# Patient Record
Sex: Female | Born: 1995 | Race: White | Hispanic: No | Marital: Married | Smoking: Never smoker
Health system: Southern US, Community
[De-identification: ages and names within clinical notes are randomized; demographics above are authoritative.]

---

## 2019-06-15 NOTE — L&D Delivery Note (Addendum)
Called to see patient for delivery  Just about 2-1/2 hours after placing 600 mcg of vaginal Cytotec patient called out to the nurse with extreme vaginal pressure.  Patient had only had minimal cramping prior to this and had not asked for any pain medications.  Upon check nurse noted fetal vertex at the introitus.  While nurse was calling me and setting up a delivery table baby spontaneously delivered.  Per nurse no respiratory effort and no heartbeat.  I arrived shortly after.  Cord was clamped and cut by nurse and parents were holding baby.  Perineal examination revealed no obstetric lacerations.  Placenta had not yet separated and a thin cord was noted outside the introitus.  At this time patient is undelivered of the placenta and planning expectant management.  Lendon Colonel 10/13/2019 1:08 AM    Almost 2 hours after delivery of the baby and without any additional Cytotec, patient went to the bathroom to void and placenta passed spontaneously . scant bleeding following.  Digital exam confirmed no additional products in the vagina and cervix was several centimeters dilated but exam is limited by patient's tension with exam, body habitus habitus, and long vagina.  No perineal lacerations were noted.  No bleeding was noted.  Placenta was examined.  Marginal cord insertion was noted.  Small clot on the placenta which appeared slightly irregular but intact.   Plan to keep patient for extended monitoring until the morning.  As long as no additional bleeding, stable vital signs and no signs of infection will be able to discharge to home to labor floor.  Lendon Colonel 10/13/2019 2:19 AM

## 2019-08-21 ENCOUNTER — Inpatient Hospital Stay (HOSPITAL_COMMUNITY): Admit: 2019-08-21 | Payer: Self-pay | Admitting: Obstetrics & Gynecology

## 2019-10-12 ENCOUNTER — Inpatient Hospital Stay (HOSPITAL_COMMUNITY)
Admission: EM | Admit: 2019-10-12 | Discharge: 2019-10-13 | DRG: 805 | Disposition: A | Discharge status: Home / Self Care | Payer: BC Managed Care – PPO | Attending: Obstetrics | Admitting: Obstetrics

## 2019-10-12 ENCOUNTER — Other Ambulatory Visit: Payer: Self-pay

## 2019-10-12 ENCOUNTER — Inpatient Hospital Stay (HOSPITAL_BASED_OUTPATIENT_CLINIC_OR_DEPARTMENT_OTHER): Payer: BC Managed Care – PPO

## 2019-10-12 ENCOUNTER — Encounter (HOSPITAL_COMMUNITY): Payer: Self-pay | Admitting: Obstetrics

## 2019-10-12 DIAGNOSIS — O418X20 Other specified disorders of amniotic fluid and membranes, second trimester, not applicable or unspecified: Secondary | ICD-10-CM | POA: Diagnosis not present

## 2019-10-12 DIAGNOSIS — Z20822 Contact with and (suspected) exposure to covid-19: Secondary | ICD-10-CM | POA: Diagnosis present

## 2019-10-12 DIAGNOSIS — O4692 Antepartum hemorrhage, unspecified, second trimester: Secondary | ICD-10-CM | POA: Diagnosis not present

## 2019-10-12 DIAGNOSIS — Z3A18 18 weeks gestation of pregnancy: Secondary | ICD-10-CM

## 2019-10-12 DIAGNOSIS — O42912 Preterm premature rupture of membranes, unspecified as to length of time between rupture and onset of labor, second trimester: Secondary | ICD-10-CM | POA: Diagnosis present

## 2019-10-12 DIAGNOSIS — O42012 Preterm premature rupture of membranes, onset of labor within 24 hours of rupture, second trimester: Secondary | ICD-10-CM | POA: Diagnosis present

## 2019-10-12 DIAGNOSIS — Z7982 Long term (current) use of aspirin: Secondary | ICD-10-CM | POA: Diagnosis not present

## 2019-10-12 DIAGNOSIS — O4690 Antepartum hemorrhage, unspecified, unspecified trimester: Secondary | ICD-10-CM

## 2019-10-12 DIAGNOSIS — O3680X0 Pregnancy with inconclusive fetal viability, not applicable or unspecified: Secondary | ICD-10-CM

## 2019-10-12 LAB — URINALYSIS, ROUTINE W REFLEX MICROSCOPIC
Bilirubin Urine: NEGATIVE
Glucose, UA: NEGATIVE mg/dL
Ketones, ur: NEGATIVE mg/dL
Nitrite: NEGATIVE
Protein, ur: 100 mg/dL — AB
Specific Gravity, Urine: 1.002 — ABNORMAL LOW (ref 1.005–1.030)
pH: 7 (ref 5.0–8.0)

## 2019-10-12 LAB — TYPE AND SCREEN
ABO/RH(D): B POS
Antibody Screen: NEGATIVE

## 2019-10-12 LAB — HEMOGLOBIN A1C
Hgb A1c MFr Bld: 5.4 % (ref 4.8–5.6)
Mean Plasma Glucose: 108.28 mg/dL

## 2019-10-12 LAB — CBC WITH DIFFERENTIAL/PLATELET
Abs Immature Granulocytes: 0.06 10*3/uL (ref 0.00–0.07)
Basophils Absolute: 0 10*3/uL (ref 0.0–0.1)
Basophils Relative: 0 %
Eosinophils Absolute: 0 10*3/uL (ref 0.0–0.5)
Eosinophils Relative: 0 %
HCT: 38.5 % (ref 36.0–46.0)
Hemoglobin: 13.1 g/dL (ref 12.0–15.0)
Immature Granulocytes: 0 %
Lymphocytes Relative: 13 %
Lymphs Abs: 2.1 10*3/uL (ref 0.7–4.0)
MCH: 30.1 pg (ref 26.0–34.0)
MCHC: 34 g/dL (ref 30.0–36.0)
MCV: 88.5 fL (ref 80.0–100.0)
Monocytes Absolute: 0.8 10*3/uL (ref 0.1–1.0)
Monocytes Relative: 5 %
Neutro Abs: 12.9 10*3/uL — ABNORMAL HIGH (ref 1.7–7.7)
Neutrophils Relative %: 82 %
Platelets: 308 10*3/uL (ref 150–400)
RBC: 4.35 MIL/uL (ref 3.87–5.11)
RDW: 12.7 % (ref 11.5–15.5)
WBC: 15.9 10*3/uL — ABNORMAL HIGH (ref 4.0–10.5)
nRBC: 0 % (ref 0.0–0.2)

## 2019-10-12 LAB — WET PREP, GENITAL
Clue Cells Wet Prep HPF POC: NONE SEEN
Sperm: NONE SEEN
Trich, Wet Prep: NONE SEEN
Yeast Wet Prep HPF POC: NONE SEEN

## 2019-10-12 LAB — RESPIRATORY PANEL BY RT PCR (FLU A&B, COVID)
Influenza A by PCR: NEGATIVE
Influenza B by PCR: NEGATIVE
SARS Coronavirus 2 by RT PCR: NEGATIVE

## 2019-10-12 LAB — POCT FERN TEST: POCT Fern Test: NEGATIVE

## 2019-10-12 LAB — TSH: TSH: 2.504 u[IU]/mL (ref 0.350–4.500)

## 2019-10-12 LAB — ABO/RH: ABO/RH(D): B POS

## 2019-10-12 MED ORDER — MISOPROSTOL 200 MCG PO TABS: 400.0000 ug | ORAL_TABLET | ORAL | Status: DC

## 2019-10-12 MED ORDER — SOD CITRATE-CITRIC ACID 500-334 MG/5ML PO SOLN: 30.0000 mL | ORAL | Status: DC | PRN

## 2019-10-12 MED ORDER — LACTATED RINGERS IV SOLN: INTRAVENOUS | Status: DC

## 2019-10-12 MED ORDER — LACTATED RINGERS IV SOLN: 500.0000 mL | INTRAVENOUS | Status: DC | PRN

## 2019-10-12 MED ORDER — MISOPROSTOL 200 MCG PO TABS
600.0000 ug | ORAL_TABLET | Freq: Once | ORAL | Status: AC
  Administered 2019-10-12: 600 ug via VAGINAL
  Filled 2019-10-12: qty 3

## 2019-10-12 MED ORDER — OXYCODONE-ACETAMINOPHEN 5-325 MG PO TABS: 2.0000 | ORAL_TABLET | ORAL | Status: DC | PRN

## 2019-10-12 MED ORDER — LIDOCAINE HCL (PF) 1 % IJ SOLN: 30.0000 mL | INTRAMUSCULAR | Status: DC | PRN

## 2019-10-12 MED ORDER — ACETAMINOPHEN 325 MG PO TABS: 650.0000 mg | ORAL_TABLET | ORAL | Status: DC | PRN

## 2019-10-12 MED ORDER — OXYCODONE-ACETAMINOPHEN 5-325 MG PO TABS: 1.0000 | ORAL_TABLET | ORAL | Status: DC | PRN

## 2019-10-12 MED ORDER — ONDANSETRON HCL 4 MG/2ML IJ SOLN: 4.0000 mg | Freq: Four times a day (QID) | INTRAMUSCULAR | Status: DC | PRN

## 2019-10-12 MED ORDER — FENTANYL CITRATE (PF) 100 MCG/2ML IJ SOLN
50.0000 ug | INTRAMUSCULAR | Status: DC | PRN
  Administered 2019-10-13: 100 ug via INTRAVENOUS
  Filled 2019-10-12: qty 2

## 2019-10-12 NOTE — MAU Provider Note (Signed)
History     CSN: [id number]  Arrival date and time: 10/12/19 1623   First Provider Initiated Contact with Patient 10/12/19 1721      Chief Complaint  Patient presents with  . Vaginal Bleeding   24 y.o. G1 @18 .1 wks presenting for VB. Reports having some LBP earlier today then around 4pm felt a pop and had a splash of fluid come out. She saw blood when she wiped. She soaked a pad since that time. Denies abdominal pain or cramping.   OB History    Gravida  1   Para      Term      Preterm      AB      Living        SAB      TAB      Ectopic      Multiple      Live Births              History reviewed. No pertinent past medical history.  History reviewed. No pertinent surgical history.  History reviewed. No pertinent family history.  Social History   Tobacco Use  . Smoking status: Never Smoker  . Smokeless tobacco: Never Used  Substance Use Topics  . Alcohol use: Never  . Drug use: Never    Allergies: Not on File  Medications Prior to Admission  Medication Sig Dispense Refill Last Dose  . aspirin EC 81 MG tablet Take 81 mg by mouth daily.   10/12/2019 at Unknown time  . metFORMIN (GLUCOPHAGE) 1000 MG tablet Take 1,000 mg by mouth 2 (two) times daily with a meal.   Past Month at Unknown time  . omega-3 acid ethyl esters (LOVAZA) 1 g capsule Take by mouth daily.   10/12/2019 at Unknown time  . Prenatal Vit-Fe Fumarate-FA (MULTIVITAMIN-PRENATAL) 27-0.8 MG TABS tablet Take 1 tablet by mouth daily at 12 noon.   10/12/2019 at Unknown time    Review of Systems  Gastrointestinal: Negative for abdominal pain.  Genitourinary: Positive for vaginal bleeding and vaginal discharge.   Physical Exam   Blood pressure 138/84, pulse (!) 110, temperature 99.4 F (37.4 C), temperature source Oral, resp. rate 17, height 5\' 3"  (1.6 m), weight 95.3 kg, SpO2 100 %.  Physical Exam  Nursing note and vitals reviewed. Constitutional: She is oriented to person, place, and  time. She appears well-developed and well-nourished. No distress.  HENT:  Head: Normocephalic and atraumatic.  Respiratory: Effort normal. No respiratory distress.  GI: Soft. She exhibits no distension and no mass. There is no abdominal tenderness. There is no rebound and no guarding.  Genitourinary:    Genitourinary Comments: SSE: no pool, bloody discharge cleared with 2 fox swabs, ?membranes protruding from os, visually closed Fern neg   Musculoskeletal:        General: Normal range of motion.     Cervical back: Normal range of motion.  Neurological: She is alert and oriented to person, place, and time.  Skin: Skin is warm and dry.  Psychiatric: She has a normal mood and affect.   Results for orders placed or performed during the hospital encounter of 10/12/19 (from the past 24 hour(s))  Wet prep, genital     Status: Abnormal   Collection Time: 10/12/19  5:31 PM  Result Value Ref Range   Yeast Wet Prep HPF POC NONE SEEN NONE SEEN   Trich, Wet Prep NONE SEEN NONE SEEN   Clue Cells Wet Prep HPF POC NONE SEEN  NONE SEEN   WBC, Wet Prep HPF POC FEW (A) NONE SEEN   Sperm NONE SEEN   Fern Test     Status: None   Collection Time: 10/12/19  5:36 PM  Result Value Ref Range   POCT Fern Test Negative = intact amniotic membranes   Urinalysis, Routine w reflex microscopic     Status: Abnormal   Collection Time: 10/12/19  5:36 PM  Result Value Ref Range   Color, Urine AMBER (A) YELLOW   APPearance CLEAR CLEAR   Specific Gravity, Urine 1.002 (L) 1.005 - 1.030   pH 7.0 5.0 - 8.0   Glucose, UA NEGATIVE NEGATIVE mg/dL   Hgb urine dipstick LARGE (A) NEGATIVE   Bilirubin Urine NEGATIVE NEGATIVE   Ketones, ur NEGATIVE NEGATIVE mg/dL   Protein, ur 828 (A) NEGATIVE mg/dL   Nitrite NEGATIVE NEGATIVE   Leukocytes,Ua TRACE (A) NEGATIVE   RBC / HPF 0-5 0 - 5 RBC/hpf   WBC, UA 0-5 0 - 5 WBC/hpf   Bacteria, UA RARE (A) NONE SEEN   Squamous Epithelial / LPF 0-5 0 - 5   [place]: cephalic, anhydramnios,  FHR 100 1840: discussed presentation and clinical findings with Dr. Ernestina Penna, will come discuss results and mgt with pt. MAU Course  Procedures  MDM Labs and [place] ordered and reviewed. PROM confirmed. Mngt per private OB.  Assessment and Plan  [redacted] weeks gestation Previable PROM Admit to LD  Mngt per Dr. Lorna Few, CNM 10/12/2019, 5:32 PM

## 2019-10-12 NOTE — MAU Note (Signed)
.  Rhonda Davis is a 24 y.o. at [redacted]w[redacted]d here in MAU reporting c/o of back pain that started at 0730. Pt reports adbominal pain that began around 1100. Pt reports she sat down to use the restroom at 1540 and felt a pop with a rush of fluids. When the patient wiped she stated that it was bloody and has since soaked one pad completely.   Onset of complaint: 10/12/19

## 2019-10-12 NOTE — H&P (Signed)
Rhonda Davis is a 24 y.o. G1P0 at [redacted]w[redacted]d presenting for preterm previable rupture of membranes. Pt notes no contractions but does note an increase in back pain.  Patient is unsure whether these are contractions are due to sitting in the uncomfortable bed in the emergency room.  Patient reports no abdominal pain.  Patient states at 4 PM today she went to the bathroom for a bowel movement and then felt a pop and noted fluid began leaking from her vagina.  She continued to leak for some time.  She noted scant bleeding with wiping but no heavy bleeding since then.  Patient's risk factors for cervical incompetence are in vitro fertilization but no other risk factors.  [facility] [place] since 10 wks -IVF pregnancy.  This pregnancy is conceived with her transmale husband's egg and donor sperm.  Preimplantation genetic testing was not done.  Normal NT scan. -PCOS.  Patient was on Metformin prior to pregnancy and in early pregnancy.  Patient stopped metformin for early diabetic screen which was 144 and held the metformin until her 3-hour GTT was done which was normal.  She has yet to resume her Metformin.   Prenatal Transfer Tool  Maternal Diabetes: No Genetic Screening: Normal Maternal Ultrasounds/Referrals: Normal Fetal Ultrasounds or other Referrals:  None Maternal Substance Abuse:  No Significant Maternal Medications:  None Significant Maternal Lab Results: None     OB History    Gravida  1   Para      Term      Preterm      AB      Living        SAB      TAB      Ectopic      Multiple      Live Births             History reviewed. No pertinent past medical history. History reviewed. No pertinent surgical history. Family History: family history is not on file. Social History:  reports that she has never smoked. She has never used smokeless tobacco. She reports that she does not drink alcohol or use drugs.  Review of Systems - Negative except Leaking fluid and  back pain     Blood pressure (!) 139/91, pulse (!) 114, temperature 98.8 F (37.1 C), temperature source Oral, resp. rate 17, height 5\' 3"  (1.6 m), weight 95.3 kg, SpO2 100 %.  Physical Exam:  Gen: well appearing, no distress, tearful and appropriate upon discussion of situation  Back: no CVAT Abd: gravid, NT, no RUQ pain LE: No edema, equal bilaterally, non-tender Toco: Not assessed FH: Present by ultrasound GU: Not repeated by me, per CNM in MAU 2 fox swabs worth of bloody discharge possible membranes protruding from os which was visibly closed  Real-time ultrasound: 18-week vertex pregnancy with active heartbeat, cervical length 4.5 cm, anhydramnios  Prenatal labs: ABO, Rh:  B positive Antibody:  Negative Rubella:  Immune RPR:   Nonreactive HBsAg:   Negative HIV:   Negative GBS:   Not yet done 1 hr Glucola 144 with normal 3-hour GTT at first trimester  Genetic screening normal NT Anatomy US not yet done   Assessment/Plan: 24 y.o. G1P0 at [redacted]w[redacted]d Preterm previable rupture of membranes.  Findings discussed with patient who was appropriately upset.  Reviewed options of expectant management with close outpatient follow-up.  I discussed with patient possibility of resealing of membranes and reaccumulation of amniotic fluid with potential to prolong the pregnancy.  We discussed  if amniotic fluid did not reaccumulate normal fetal lungs would not develop.  We also discussed the possibility that with expectant management patient could develop chorioamnionitis which could lead to more systemic infection and or problems with bleeding.  We discussed the possibility for close outpatient management for developing infection and ultrasounds to assess for fluid reaccumulation.  We discussed that while fluid reaccumulation is a possibility,that at her very early gestational age even with a several week latency this would still not get this pregnancy to viability.  Patient remains nervous about going  home and the possibility of expectant management.  Patient was offered ability to spend the night for further evaluation and to give her and her spouse time to process the events and make a decision.  Patient seems very concerned about the possibility of having to deliver a previable pregnancy.  We briefly discussed options of a D&C and risk and benefits of a D&C were discussed with patient.  We then discussed Cytotec induction of labor which patient expressed interest in.  I discussed patient should proceed to the labor floor she would be given IV and would plan vaginal Cytotec to allow cervical dilation.  I discussed with patient the process would seem similar to labor.  Patient would have the option for IV pain medications and epidural.  Patient is desiring an epidural.  We discussed options for chromosomal testing and autopsy both of which patient and husband expressed an interest in doing.  We discussed the possibility that baby may be born dead or alive and the ability to see and hold the baby.  They would like to see and hold the baby in either event.  I discussed that baby may not seem fully formed due to early gestational age and they were prepared for this.  I discussed the possibility of retained placenta and possibly needing to proceed to the OR for D&C of a retained placenta.  I discussed with them the likelihood they may develop a fever during the labor process either from infection or from multiple doses of Cytotec.  Patient has no known drug allergies and I discussed we would give antibiotics in the event a fever developed.  Given the low chance of success of healthy pregnancy with previable rupture at 18 weeks and infectious risk patient and spouse have decided for termination.  Lendon Colonel 10/12/2019 8:39 PM

## 2019-10-13 LAB — RPR: RPR Ser Ql: NONREACTIVE

## 2019-10-13 MED ORDER — OXYTOCIN 40 UNITS IN NORMAL SALINE INFUSION - SIMPLE MED
INTRAVENOUS | Status: AC
  Filled 2019-10-13: qty 1000

## 2019-10-13 MED ORDER — IBUPROFEN 600 MG PO TABS: 600.0000 mg | ORAL_TABLET | Freq: Four times a day (QID) | ORAL | Status: DC

## 2019-10-13 MED ORDER — ZOLPIDEM TARTRATE ER 6.25 MG PO TBCR
6.2500 mg | EXTENDED_RELEASE_TABLET | Freq: Every evening | ORAL | 0 refills | Status: AC | PRN
Start: 2019-10-13 — End: ?

## 2019-10-13 NOTE — Discharge Instructions (Signed)

## 2019-10-13 NOTE — Progress Notes (Signed)
Dr. Ernestina Penna notified at 0016 that patient was having increased pain and pressure. Cervical exam was done and baby was in the vaginal canal. Infant delivered at 0017 with no signs of life in the bed. The cord was clamped and baby was wrapped in a blanket and placed on mothers chest. Parents were given time to hold baby. Baby was taken to the comfort room at 0130. Pictures and footprints were taken. Parents were given the memory box. Placenta delivered at 0204.

## 2019-10-13 NOTE — Progress Notes (Signed)
Entered room to see pt and support person. Both sleeping soundly. RN name written on whiteboard for reference. Will check back in and evaluate pt in 30-40 minutes.

## 2019-10-13 NOTE — Progress Notes (Signed)
0900 Eating breakfast with family member. Talking on phone with family. Pt set up to take shower.

## 2019-10-14 NOTE — Discharge Summary (Signed)
Patient ID: Rhonda Davis MRN: [id number] DOB/AGE: 10-25-1995 24 y.o.  Admit date: 10/12/2019 Discharge date: Oct 13, 2019  Admission Diagnoses: Preterm PROM w/onset labor within 24 hours rupture in 2nd trimester [O42.012]  Discharge Diagnoses: Preterm PROM w/onset labor within 24 hours rupture in 2nd trimester [O42.012]         Discharged Condition: stable  Hospital Course: Patient presented to ER and then to maternity admission with vaginal bleeding, large gush of fluid and back pain.  She was found to have a white count of 15.9 with left shift, nontender uterus, possible membranes protruding from the cervical os and anhydramnios consistent with ruptured membranes.  Long consult was had with the patient regarding expectant management versus D&C versus induction of labor with Cytotec.  Patient chose the latter.  She had 600 mcg of Cytotec placed once and had rapid onset of labor and delivery of a nonviable and nonbreathing 18-week fetus without heartbeat about 3 hours later.  She had no obstetrical lacerations.  Spontaneous delivery of the placenta about 2 hours later.  Patient and spouse held the baby.  Patient requested autopsy and genetic screening.  Patient never developed a fever.  Patient was watched overnight.  In the morning bleeding was minimal, uterus was nontender patient was counseled about reasons to call or return to the hospital.  Emotional support was discussed and patient and husband seem to to be appropriate.  Consults: None and MFM  Treatments: Induction of labor with Cytotec due to previable preterm premature rupture of membranes and delivery of a nonviable fetus  Disposition: home   Allergies as of 10/13/2019   No Known Allergies     Medication List    STOP taking these medications   aspirin EC 81 MG tablet     TAKE these medications   metFORMIN 1000 MG tablet Commonly known as: GLUCOPHAGE Take 1,000 mg by mouth 2 (two) times daily with a meal.    multivitamin-prenatal 27-0.8 MG Tabs tablet Take 1 tablet by mouth daily at 12 noon.   omega-3 acid ethyl esters 1 g capsule Commonly known as: LOVAZA Take by mouth daily.   zolpidem 6.25 MG CR tablet Commonly known as: Ambien CR Take 1 tablet (6.25 mg total) by mouth at bedtime as needed for sleep.      Follow-up Information    Noland Fordyce, MD Follow up in 2 week(s).   Specialty: Obstetrics and Gynecology Contact information: [address] [place] [address] [phone]           Signed: Lendon Colonel, MD MD 10/14/2019, 12:33 PM

## 2019-10-15 LAB — GC/CHLAMYDIA PROBE AMP (~~LOC~~) NOT AT ARMC
Chlamydia: NEGATIVE
Comment: NEGATIVE
Comment: NORMAL
Neisseria Gonorrhea: NEGATIVE

## 2019-10-16 LAB — SURGICAL PATHOLOGY

## 2019-10-29 ENCOUNTER — Other Ambulatory Visit: Payer: Self-pay | Admitting: Obstetrics & Gynecology

## 2019-10-31 ENCOUNTER — Ambulatory Visit: Payer: BC Managed Care – PPO | Admitting: *Deleted

## 2019-10-31 ENCOUNTER — Other Ambulatory Visit: Payer: Self-pay

## 2019-10-31 ENCOUNTER — Ambulatory Visit: Payer: BC Managed Care – PPO | Attending: Obstetrics and Gynecology | Admitting: Obstetrics and Gynecology

## 2019-10-31 DIAGNOSIS — O09292 Supervision of pregnancy with other poor reproductive or obstetric history, second trimester: Secondary | ICD-10-CM

## 2019-10-31 DIAGNOSIS — Z3161 Procreative counseling and advice using natural family planning: Secondary | ICD-10-CM | POA: Diagnosis not present

## 2019-10-31 DIAGNOSIS — Z8759 Personal history of other complications of pregnancy, childbirth and the puerperium: Secondary | ICD-10-CM | POA: Diagnosis not present

## 2019-10-31 DIAGNOSIS — Z7189 Other specified counseling: Secondary | ICD-10-CM | POA: Insufficient documentation

## 2019-10-31 DIAGNOSIS — I10 Essential (primary) hypertension: Secondary | ICD-10-CM | POA: Insufficient documentation

## 2019-10-31 DIAGNOSIS — Z3169 Encounter for other general counseling and advice on procreation: Secondary | ICD-10-CM | POA: Insufficient documentation

## 2019-10-31 DIAGNOSIS — N883 Incompetence of cervix uteri: Secondary | ICD-10-CM | POA: Diagnosis not present

## 2019-10-31 DIAGNOSIS — O0990 Supervision of high risk pregnancy, unspecified, unspecified trimester: Secondary | ICD-10-CM

## 2019-10-31 NOTE — Progress Notes (Signed)
Pt to have MFM consult with Dr. Judeth Cornfield. BP 151/101, p 99. Denies headache, visual changes or swelling.

## 2019-10-31 NOTE — Progress Notes (Signed)
Maternal-Fetal Medicine  Name: Dusty Wagoner MRN: [id number] Referring Provider: Dr. Park Breed  Ms. Rhonda Davis [id number], is here for preconception consultation. Obstetric history is significant for an 18-week pregnancy loss.  Patient had IVF pregnancy and conceived with her transmale husband's egg and donor sperm.  2 embryos were implanted and she had singleton pregnancy.  Patient reports that she had early pregnancy heavy bleeding with clots around 6 to [redacted] weeks gestation.  At [redacted] weeks gestation, she had vaginal bleeding and clots and on ultrasound evaluation anhydramnios was seen on ultrasound.  Patient was counseled on the poor prognosis and she opted for misoprostol termination of pregnancy.  She delivered a nonviable female fetus weighing 157 g at birth fetal autopsy (gross examination) was normal.  Placenta weighed 58 g with evidence of mild chorioamnionitis.  Patient did not have any fever and her WBC at admission was 15.9.  Her hemoglobin A1c was 5.4%.  Between 7 weeks and [redacted] weeks gestation she had uneventful pregnancy.  She had abnormal 1 hour GCT followed by an normal 3-hour GTT.  She was taking Metformin 1000 mg twice daily.  Gyn history: No history of abnormal Pap smears or cervical surgeries. Patient has PCOS and has resumed metformin at 1,000 mg daily. She is planning IVF with her husband's egg and donor sperm.  Past medical history: Patient reports she possibly has hypertension.  Today's blood pressure was 151/101 mmHg.  No history of diabetes or any other chronic medical conditions. No history of thyroid disorder.  Past surgical history: Nil of note. Medications: Metformin. Allergies: No known drug allergies. Social history: Denies tobacco or drug or alcohol use.  Her husband is in good health.  Patient is Caucasian and her husband is Latino (as reported by patient). Family history: Father has diabetes and has a history of cardiac stent surgery.  He also had deep vein thrombosis.   Mother is in good health.  Siblings have prediabetes.  I counseled the patient on the following: History of mid-trimester pregnancy loss: -We discussed possible causes including vaginal bleeding, infection, and cervical incompetence. -Patient had PPROM and the cervix was not dilated. She had to undergo misoprostol termination. Although cervical incompetence is unlikely, it cannot be definitively ruled out. -Vaginal bleeding in pregnancy is associated with a higher incidence of miscarriages and it is possible it led to PPROM. -Infection: Placental histology showed evidence of "mild chorioamnionitis" even though it cannot be taken as a conclusive evidence of infection. Subclinical infection is a possibility. -I discussed and recommended serial cervical length measurements from 16 weeks till 23 weeks' gestation. If cervical incompetence (shortening) is detected, rescue cerclage should be performed. -Alternatively, she can have prophylactic cerclage (inferior option) understanding that diagnosis of cervical incompetence cannot be made with certainty. Patient opted to have serial cervical length measurements in her next pregnancy. Patient had fertility treatment that would involve multiple pelvic ultrasounds. She informed that she was not informed of any uterine cavitary abnormalities (polyp/septum/myomas) that would lead to mid-trimester pregnancy loss. Prophylactic progesterone injections (17-OHP) is not indicated.  Chronic hypertension:  Based on her history and today's blood pressure, she is likely to have chronic hypertension.  Possible adverse effects from uncontrolled hypertension. Adverse effects in pregnancy correlate well with the severity of hypertension and the duration of hypertension. Maternal complications (in poorly-controlled hypertension) include stroke, pulmonary edema, and renal failure. Placental abruption fetal growth restriction. Superimposed preeclamspsia occurs in up to 30% of  patients with chronic hypertension.  Most antihypertensives prescribed in  pregnancy have established safety-profiles. Labetolol and methyldopa are the most-commonly used drugs. No increased congenital malformations have been noted with these drugs. Calcium channel blocker (nifedipine) can also be used as part of multi-drug regimen. ACE inhibitors are contraindicated in pregnancy. Serial fetal growth assessments should be performed in pregnancy and delivery should be considered at 39 weeks' gestation provided blood pressures are well controlled. Low-dose aspirin reduces the likelihood of developing preeclampsia. I recommend aspirin 81 mg PO daily starting at 12-weeks' gestation till delivery. Patient is not allergic to aspirin and does not give history of gastric ulcers or bleeding.  Preconception:  I advised her to take prenatal vitamins daily or folic acid 400 micrograms daily to reduce the likelihood of open-neural tube defects. Preimplantation genetic screening is an option to detect chromosomal anomalies. Fetuses with chromosomal anomalies are more likely to have miscarriages.  Recommendations: -Preconception folic acid or prenatal vitamins. -Weekly cervical length measurements from 16 weeks' gestation till 23 weeks. -PCP to monitor her blood pressures and assess need for antihypertensives.  Thank you for consultation. If you have any questions, please contact me at the [facility] [phone]). Consultation including face-to-face counseling: 45 minutes.

## 2021-12-04 IMAGING — US US MFM OB LIMITED
1 series · 7 of 7 positions shown · non-contrast
Comparison: none

[Series 1: us mfm ob limited · 7 of 7 slices shown]
[im 1/7]
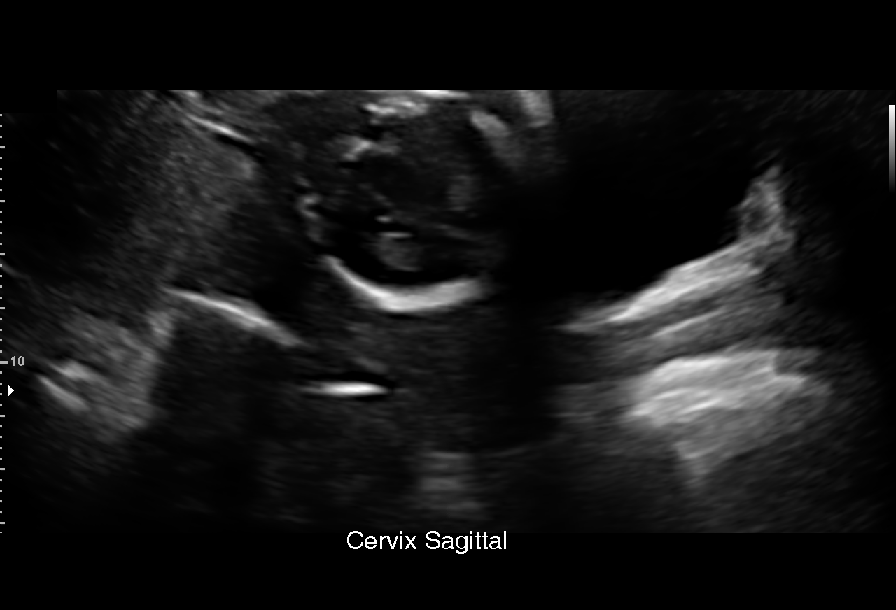
[im 2/7]
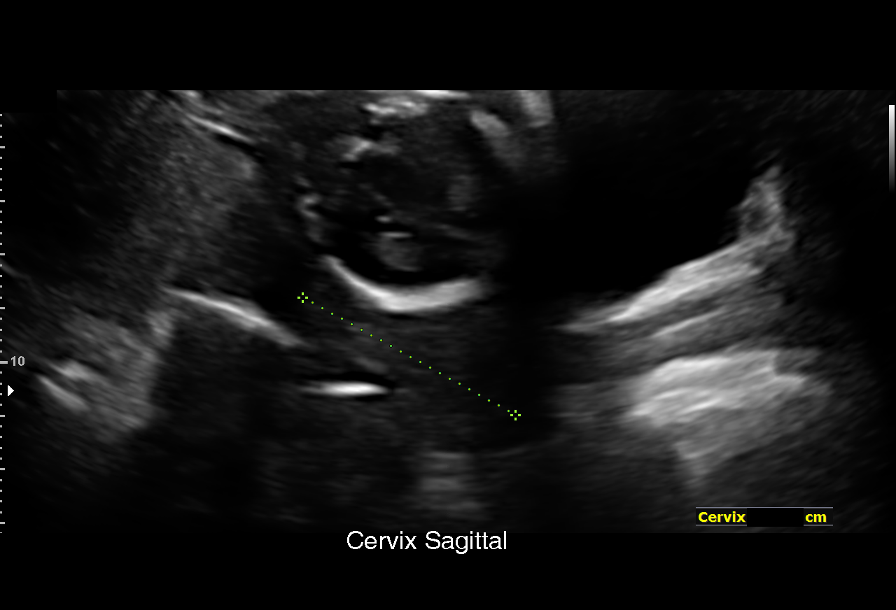
[im 3/7]
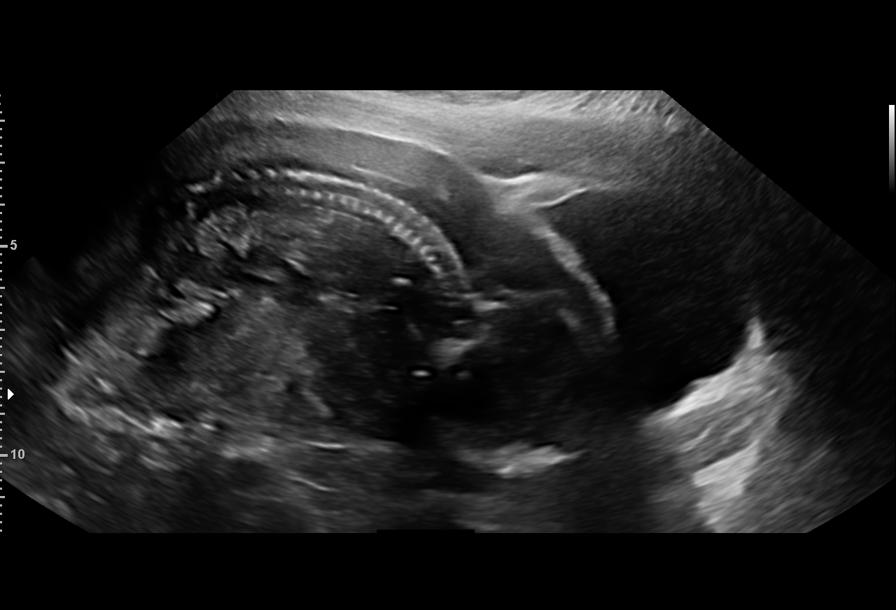
[im 4/7]
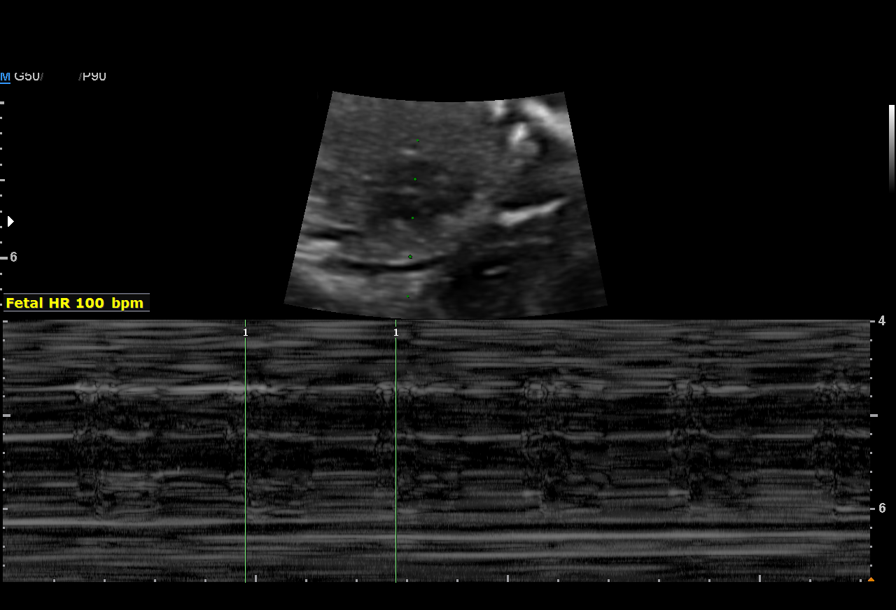
[im 5/7]
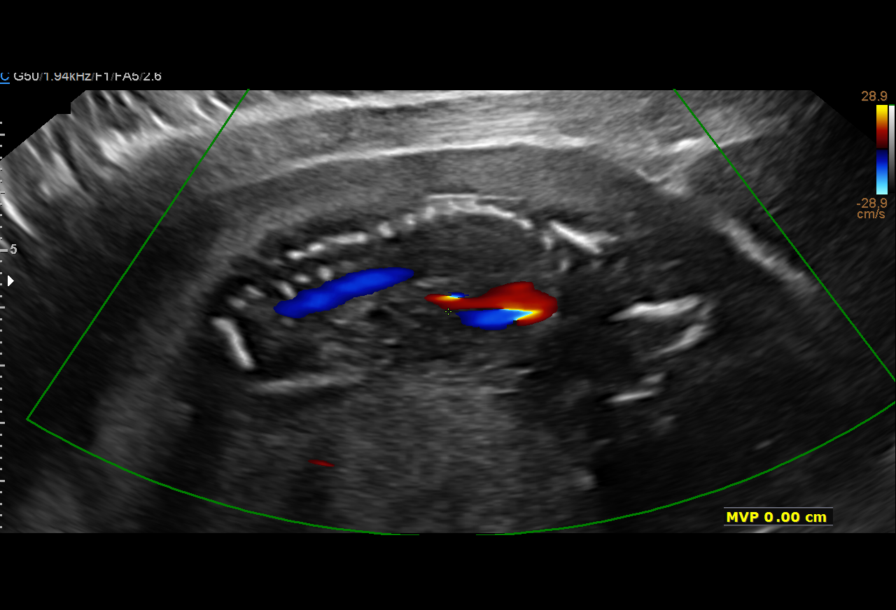
[im 6/7]
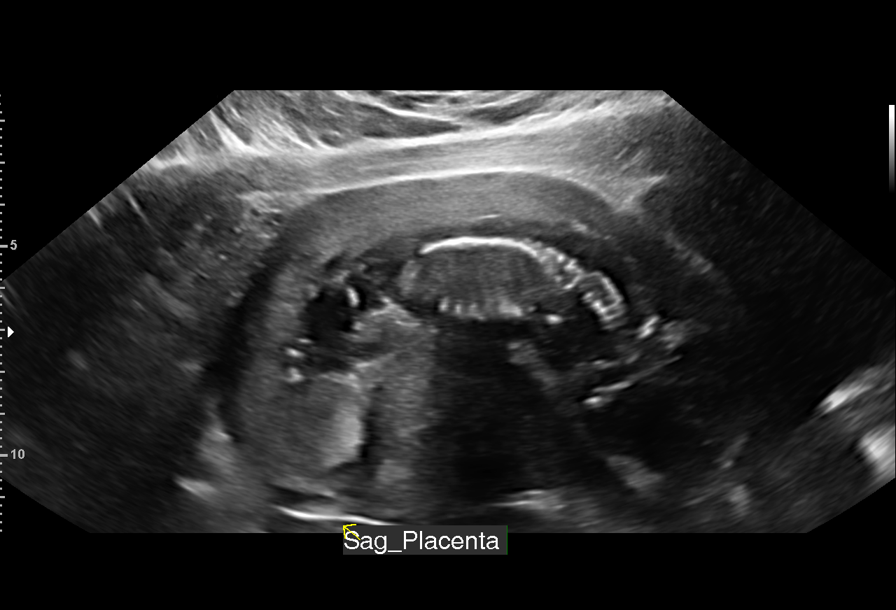
[im 7/7]
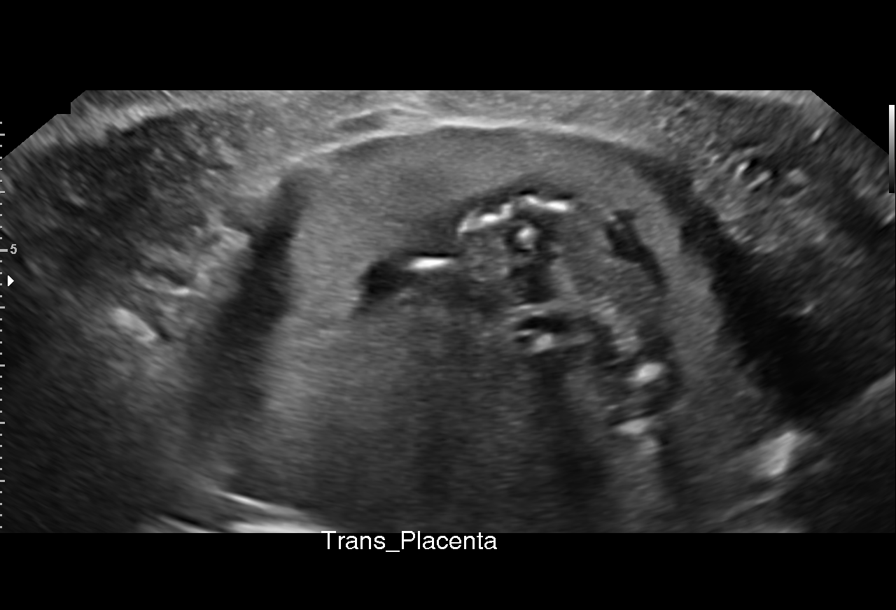

[7 of 7 positions shown; findings below may reference images not displayed]

Attending:        Indya Simone      Secondary Phy.:    RUBEN ADALYL DARGOR
                                                             JUMPER CNM
 Referred By:      [facility] MAU/Triage         [facility]  US MFM OB LIMITED                     76815.01    RIKY RU

Indications

 18 weeks gestation of pregnanc
 Vaginal bleeding in pregnancy, second
 trimester
Fetal Evaluation

 Num Of Fetuses:          1
 Fetal Heart Rate(bpm):   100
 Cardiac Activity:        Observed
 Presentation:            Cephalic
 Placenta:                Anterior Fundal
 P. Cord Insertion:       Not well visualized

 Amniotic Fluid
 AFI FV:      Anhydramnios
OB History

 Maternal Racial/Ethnic Group:   White
 Gravidity:    1         Term:   0
 Living:       0
Gestational Age

 Clinical EDD:  18w 1d                                        EDD:   03/13/20
 Best:          18w 1d     Det. By:  Clinical EDD             EDD:   03/13/20
Impression

 Limited exam to vaginal bleeding
 Anyhydramnios
Recommendations

 Follow up per inpatient provider
